# Patient Record
Sex: Female | Born: 1975
Health system: Southern US, Community
[De-identification: ages and names within clinical notes are randomized; demographics above are authoritative.]

## PROBLEM LIST (undated history)

## (undated) DIAGNOSIS — R87629 Unspecified abnormal cytological findings in specimens from vagina: Secondary | ICD-10-CM

## (undated) HISTORY — PX: BREAST ENHANCEMENT SURGERY: SHX7

## (undated) HISTORY — DX: Unspecified abnormal cytological findings in specimens from vagina: R87.629

## (undated) HISTORY — PX: ABLATION: SHX5711

## (undated) HISTORY — PX: COLPOSCOPY: SHX161

---

## 2016-12-21 ENCOUNTER — Emergency Department (INDEPENDENT_AMBULATORY_CARE_PROVIDER_SITE_OTHER)
Admission: EM | Admit: 2016-12-21 | Discharge: 2016-12-21 | Disposition: A | Discharge status: Home / Self Care | Payer: BLUE CROSS/BLUE SHIELD | Source: Home / Self Care | Attending: Family Medicine | Admitting: Family Medicine

## 2016-12-21 ENCOUNTER — Encounter: Payer: Self-pay | Admitting: Emergency Medicine

## 2016-12-21 ENCOUNTER — Emergency Department (INDEPENDENT_AMBULATORY_CARE_PROVIDER_SITE_OTHER): Payer: BLUE CROSS/BLUE SHIELD

## 2016-12-21 DIAGNOSIS — M7741 Metatarsalgia, right foot: Secondary | ICD-10-CM

## 2016-12-21 DIAGNOSIS — M79671 Pain in right foot: Secondary | ICD-10-CM | POA: Diagnosis not present

## 2016-12-21 MED ORDER — MELOXICAM 15 MG PO TABS: 15.0000 mg | ORAL_TABLET | Freq: Every day | ORAL | 0 refills | Status: DC

## 2016-12-21 NOTE — Discharge Instructions (Addendum)
Apply ice pack for 15 to 20 minutes, 2 to 3 times daily  Continue until pain decreases.

## 2016-12-21 NOTE — ED Triage Notes (Signed)
Pt c/o right foot pain on the bottom of her foot only. Started suddenly last night. Denies injury or meds for this.

## 2016-12-21 NOTE — ED Provider Notes (Signed)
[facility]    CSN: [id number] Arrival date & time: 12/21/16  0847     History   Chief Complaint Chief Complaint  Patient presents with  . Foot Pain    HPI Stacey Finley is a 41 y.o. female.   While walking up stairs last night, patient had sudden pain in her right foot over MTP joints that has persisted.  She recalls no injury or recent change in physical activities.  She has pain with weight bearing.  She admits that she exercises regularly, including walking/running on treadmill.   The history is provided by the patient.  Foot Pain  This is a new problem. The current episode started yesterday. The problem occurs constantly. The problem has not changed since onset.The symptoms are aggravated by walking. Nothing relieves the symptoms. She has tried nothing for the symptoms.    History reviewed. No pertinent past medical history.  There are no active problems to display for this patient.   History reviewed. No pertinent surgical history.  OB History    No data available       Home Medications    Prior to Admission medications   Medication Sig Start Date End Date Taking? Authorizing Provider  meloxicam (MOBIC) 15 MG tablet Take 1 tablet (15 mg total) by mouth daily. Take with food each morning 12/21/16   Kandra Nicolas, MD    Family History History reviewed. No pertinent family history.  Social History Social History  Substance Use Topics  . Smoking status: Never Smoker  . Smokeless tobacco: Never Used  . Alcohol use 2.4 oz/week    4 Cans of beer per week     Allergies   Patient has no allergy information on record.   Review of Systems Review of Systems  All other systems reviewed and are negative.    Physical Exam Triage Vital Signs ED Triage Vitals  Enc Vitals Group     BP 12/21/16 0905 125/84     Pulse Rate 12/21/16 0905 68     Resp --      Temp 12/21/16 0905 98.5 F (36.9 C)     Temp Source 12/21/16 0905 Oral     SpO2  12/21/16 0905 98 %     Weight 12/21/16 0906 159 lb (72.1 kg)     Height --      Head Circumference --      Peak Flow --      Pain Score 12/21/16 0906 7     Pain Loc --      Pain Edu? --      Excl. in [place]? --    No data found.   Updated Vital Signs BP 125/84 (BP Location: Right Arm)   Pulse 68   Temp 98.5 F (36.9 C) (Oral)   Wt 159 lb (72.1 kg)   SpO2 98%   Visual Acuity Right Eye Distance:   Left Eye Distance:   Bilateral Distance:    Right Eye Near:   Left Eye Near:    Bilateral Near:     Physical Exam  Constitutional: She appears well-developed and well-nourished. No distress.  HENT:  Head: Normocephalic.  Eyes: Pupils are equal, round, and reactive to light.  Cardiovascular: Normal rate.   Pulmonary/Chest: Effort normal.  Musculoskeletal:       Right foot: There is tenderness and bony tenderness. There is normal range of motion, no swelling, normal capillary refill, no crepitus, no deformity and no laceration.  Feet:  Right foot has distinct tenderness to palpation over the 3rd and 4th MTP joints and metatarsals.  No swelling, erythema, or warmth.  Distal neurovascular function is intact.   Neurological: She is alert.  Skin: Skin is warm and dry.  Nursing note and vitals reviewed.    UC Treatments / Results  Labs (all labs ordered are listed, but only abnormal results are displayed) Labs Reviewed - No data to display  EKG  EKG Interpretation None       Radiology Dg Foot Complete Right  Result Date: 12/21/2016 CLINICAL DATA:  Sudden onset of right foot pain beginning last night. No known injury. EXAM: RIGHT FOOT COMPLETE - 3+ VIEW COMPARISON:  None. FINDINGS: There is no evidence of fracture or dislocation. No erosion or bone lesion. Soft tissues are unremarkable. IMPRESSION: Negative. Electronically Signed   By: Monte Fantasia M.D.   On: 12/21/2016 10:05    Procedures Procedures (including critical care time)  Medications Ordered in  UC Medications - No data to display   Initial Impression / Assessment and Plan / UC Course  I have reviewed the triage vital signs and the nursing notes.  Pertinent labs & imaging results that were available during my care of the patient were reviewed by me and considered in my medical decision making (see chart for details).    Differential includes stress fracture, metatarsalgia, Morton's neuroma, foot sprain.  Sudden onset suggests possibility of stress fracture. Begin Mobic Apply ice pack for 15 to 20 minutes, 2 to 3 times daily  Continue until pain decreases. Followup with Dr. Aundria Mems one week.       Final Clinical Impressions(s) / UC Diagnoses   Final diagnoses:  Metatarsalgia of right foot    New Prescriptions New Prescriptions   MELOXICAM (MOBIC) 15 MG TABLET    Take 1 tablet (15 mg total) by mouth daily. Take with food each morning     Kandra Nicolas, MD 12/21/16 1227

## 2016-12-28 ENCOUNTER — Encounter: Payer: Self-pay | Admitting: Sports Medicine

## 2016-12-28 ENCOUNTER — Ambulatory Visit (INDEPENDENT_AMBULATORY_CARE_PROVIDER_SITE_OTHER): Payer: BLUE CROSS/BLUE SHIELD | Admitting: Sports Medicine

## 2016-12-28 DIAGNOSIS — G5761 Lesion of plantar nerve, right lower limb: Secondary | ICD-10-CM | POA: Insufficient documentation

## 2016-12-28 MED ORDER — MELOXICAM 15 MG PO TABS: ORAL_TABLET | ORAL | 3 refills | Status: DC

## 2016-12-28 NOTE — Assessment & Plan Note (Signed)
Metatarsal pad placed in sandals which resolved her pain.  She will return for custom orthotics with metatarsal pads. Adding meloxicam.

## 2016-12-28 NOTE — Progress Notes (Signed)
   Subjective:    I'm seeing this patient as a consultation for:  Carolee Rota, NP, Dr. Theone Murdoch  CC:  Right foot pain  HPI: This is a pleasant 41 year old female, she has right foot pain, plantar between 3rd and 4th metatarsal heads with numbness and tingling for several weeks.  She was seen in urgent care and referred to me for further evaluation and definitive treatment.  Symptoms are moderate, persistent.  Past medical history, Surgical history, Family history not pertinant except as noted below, Social history, Allergies, and medications have been entered into the medical record, reviewed, and no changes needed.   Review of Systems: No headache, visual changes, nausea, vomiting, diarrhea, constipation, dizziness, abdominal pain, skin rash, fevers, chills, night sweats, weight loss, swollen lymph nodes, body aches, joint swelling, muscle aches, chest pain, shortness of breath, mood changes, visual or auditory hallucinations.   Objective:   General: Well Developed, well nourished, and in no acute distress.  Neuro:  Extra-ocular muscles intact, able to move all 4 extremities, sensation grossly intact.  Deep tendon reflexes tested were normal. Psych: Alert and oriented, mood congruent with affect. ENT:  Ears and nose appear unremarkable.  Hearing grossly normal. Neck: Unremarkable overall appearance, trachea midline.  No visible thyroid enlargement. Eyes: Conjunctivae and lids appear unremarkable.  Pupils equal and round. Skin: Warm and dry, no rashes noted.  Cardiovascular: Pulses palpable, no extremity edema. Right Foot: No visible erythema or swelling. Range of motion is full in all directions. Strength is 5/5 in all directions. No hallux valgus. No pes cavus or pes planus. No abnormal callus noted. No pain over the navicular prominence, or base of fifth metatarsal. No tenderness to palpation of the calcaneal insertion of plantar fascia. No pain at the Achilles  insertion. No pain over the calcaneal bursa. No pain of the retrocalcaneal bursa. Tender to palpation on the plantar third and fourth MTP with reproduction of pain with compression of the third and fourth metatarsal heads. No hallux rigidus or limitus. No tenderness palpation over interphalangeal joints. Neurovascularly intact distally.  Metatarsal pad placed in the standard, this resolved all pain immediately.  Impression and Recommendations:   This case required medical decision making of moderate complexity.  Morton neuroma, right Metatarsal pad placed in sandals which resolved her pain.  She will return for custom orthotics with metatarsal pads. Adding meloxicam.

## 2017-01-01 ENCOUNTER — Ambulatory Visit (INDEPENDENT_AMBULATORY_CARE_PROVIDER_SITE_OTHER): Payer: BLUE CROSS/BLUE SHIELD | Admitting: Sports Medicine

## 2017-01-01 VITALS — BP 138/81 | HR 61 | Wt 158.0 lb

## 2017-01-01 DIAGNOSIS — G5761 Lesion of plantar nerve, right lower limb: Secondary | ICD-10-CM | POA: Diagnosis not present

## 2017-01-01 NOTE — Progress Notes (Signed)
    Patient was fitted for a : standard, cushioned, semi-rigid orthotic. The orthotic was heated and afterward the patient stood on the orthotic blank positioned on the orthotic stand. The patient was positioned in subtalar neutral position and 10 degrees of ankle dorsiflexion in a weight bearing stance. After completion of molding, a stable base was applied to the orthotic blank. The blank was ground to a stable position for weight bearing. Size: 8 Base: [employer] and safety inspector and Padding: Right MT pad. The patient ambulated these, and they were very comfortable.  I spent 40 minutes with this patient, greater than 50% was face-to-face time counseling regarding the below diagnosis.

## 2017-01-01 NOTE — Assessment & Plan Note (Signed)
Administrator as above. Metatarsal pad under the right orthotic

## 2017-01-29 ENCOUNTER — Ambulatory Visit: Payer: BLUE CROSS/BLUE SHIELD | Admitting: Sports Medicine

## 2017-02-01 ENCOUNTER — Ambulatory Visit: Payer: BLUE CROSS/BLUE SHIELD | Admitting: Sports Medicine

## 2017-02-01 DIAGNOSIS — Z0189 Encounter for other specified special examinations: Secondary | ICD-10-CM

## 2017-02-04 ENCOUNTER — Encounter: Payer: Self-pay | Admitting: Sports Medicine

## 2017-02-04 ENCOUNTER — Ambulatory Visit (INDEPENDENT_AMBULATORY_CARE_PROVIDER_SITE_OTHER): Payer: BLUE CROSS/BLUE SHIELD | Admitting: Sports Medicine

## 2017-02-04 DIAGNOSIS — G5761 Lesion of plantar nerve, right lower limb: Secondary | ICD-10-CM | POA: Diagnosis not present

## 2017-02-04 NOTE — Assessment & Plan Note (Addendum)
Pain-free when she wears the custom orthotics with metatarsal pad. Does have some discomfort when walking barefoot understandably. I have recommended that she invest in some good house slippers. Return as needed.

## 2017-02-04 NOTE — Progress Notes (Signed)
  Subjective:    CC: Follow-up  HPI: Morton's neuroma right:  Symptoms completely resolved with custom orthotics and metatarsal pads.  She does have some pain when walking barefoot but understands that I not really have much ability to control this.  Past medical history:  Negative.  See flowsheet/record as well for more information.  Surgical history: Negative.  See flowsheet/record as well for more information.  Family history: Negative.  See flowsheet/record as well for more information.  Social history: Negative.  See flowsheet/record as well for more information.  Allergies, and medications have been entered into the medical record, reviewed, and no changes needed.   Review of Systems: No fevers, chills, night sweats, weight loss, chest pain, or shortness of breath.   Objective:    General: Well Developed, well nourished, and in no acute distress.  Neuro: Alert and oriented x3, extra-ocular muscles intact, sensation grossly intact.  HEENT: Normocephalic, atraumatic, pupils equal round reactive to light, neck supple, no masses, no lymphadenopathy, thyroid nonpalpable.  Skin: Warm and dry, no rashes. Cardiac: Regular rate and rhythm, no murmurs rubs or gallops, no lower extremity edema.  Respiratory: Clear to auscultation bilaterally. Not using accessory muscles, speaking in full sentences.  Impression and Recommendations:    Morton neuroma, right Pain-free when she wears the custom orthotics with metatarsal pad. Does have some discomfort when walking barefoot understandably. I have recommended that she invest in some good house slippers. Return as needed.  ___________________________________________ Gwen Her. Dianah Field, M.D., ABFM., CAQSM. Primary Care and [place] Instructor of [place] of [facility] of Medicine

## 2018-01-07 ENCOUNTER — Other Ambulatory Visit: Payer: Self-pay | Admitting: Sports Medicine

## 2018-01-07 DIAGNOSIS — G5761 Lesion of plantar nerve, right lower limb: Secondary | ICD-10-CM

## 2018-01-10 ENCOUNTER — Other Ambulatory Visit: Payer: Self-pay | Admitting: Sports Medicine

## 2018-01-10 DIAGNOSIS — G5761 Lesion of plantar nerve, right lower limb: Secondary | ICD-10-CM

## 2018-02-14 ENCOUNTER — Encounter: Payer: Self-pay | Admitting: Obstetrics & Gynecology

## 2018-02-14 ENCOUNTER — Ambulatory Visit (INDEPENDENT_AMBULATORY_CARE_PROVIDER_SITE_OTHER): Payer: BLUE CROSS/BLUE SHIELD | Admitting: Obstetrics & Gynecology

## 2018-02-14 VITALS — BP 138/86 | HR 64 | Ht 66.0 in | Wt 154.0 lb

## 2018-02-14 DIAGNOSIS — R5383 Other fatigue: Secondary | ICD-10-CM

## 2018-02-14 DIAGNOSIS — Z01419 Encounter for gynecological examination (general) (routine) without abnormal findings: Secondary | ICD-10-CM

## 2018-02-14 DIAGNOSIS — L853 Xerosis cutis: Secondary | ICD-10-CM

## 2018-02-14 DIAGNOSIS — Z1151 Encounter for screening for human papillomavirus (HPV): Secondary | ICD-10-CM | POA: Diagnosis not present

## 2018-02-14 DIAGNOSIS — Z124 Encounter for screening for malignant neoplasm of cervix: Secondary | ICD-10-CM

## 2018-02-14 NOTE — Progress Notes (Signed)
Last pap smear- over 3 years ago- normal Never had mammogram

## 2018-02-14 NOTE — Progress Notes (Signed)
Subjective:    Stacey Finley is a 42 y.o.married P2 (61 and 85 yo kids) female who presents for an annual exam. The patient has no complaints today. She needs a pap smear. She has low energy and dry skin. The patient is sexually active. GYN screening history: last pap: was normal. The patient wears seatbelts: yes. The patient participates in regular exercise: yes. ( weights) Has the patient ever been transfused or tattooed?: yes. The patient reports that there is not domestic violence in her life.   Menstrual History: OB History    Gravida  3   Para  2   Term  2   Preterm      AB  1   Living  2     SAB  1   TAB      Ectopic      Multiple      Live Births              Menarche age: 27 No LMP recorded. Patient has had an ablation.    The following portions of the patient's history were reviewed and updated as appropriate: allergies, current medications, past family history, past medical history, past social history, past surgical history and problem list.  Review of Systems Pertinent items are noted in HPI.   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), has a major in health and exercise and masters in Manufacturing engineer Married for 8 years, uses a BTL for contraception She had an ablation in 2012, no periods :) Had fat transferred to her breast   Objective:    BP 138/86   Pulse 64   Ht 5' 6"  (1.676 m)   Wt 154 lb (69.9 kg)   BMI 24.86 kg/m   General Appearance:    Alert, cooperative, no distress, appears stated age  Head:    Normocephalic, without obvious abnormality, atraumatic  Eyes:    PERRL, conjunctiva/corneas clear, EOM's intact, fundi    benign, both eyes  Ears:    Normal TM's and external ear canals, both ears  Nose:   Nares normal, septum midline, mucosa normal, no drainage    or sinus tenderness  Throat:   Lips, mucosa, and tongue normal;  teeth and gums normal  Neck:   Supple, symmetrical, trachea midline, no adenopathy;    thyroid:  no enlargement/tenderness/nodules; no carotid   bruit or JVD  Back:     Symmetric, no curvature, ROM normal, no CVA tenderness  Lungs:     Clear to auscultation bilaterally, respirations unlabored  Chest Wall:    No tenderness or deformity   Heart:    Regular rate and rhythm, S1 and S2 normal, no murmur, rub   or gallop  Breast Exam:    No tenderness, masses, or nipple abnormality  Abdomen:     Soft, non-tender, bowel sounds active all four quadrants,    no masses, no organomegaly  Genitalia:    Normal female without lesion, discharge or tenderness, normal size and shape, anteverted, mobile, non-tender, normal adnexal exam      Extremities:   Extremities normal, atraumatic, no cyanosis or edema  Pulses:   2+ and symmetric all extremities  Skin:   Skin color, texture, turgor normal, no rashes or lesions  Lymph nodes:   Cervical, supraclavicular, and axillary nodes normal  Neurologic:   CNII-XII intact, normal strength, sensation and reflexes    throughout  .  Assessment:    Healthy female exam.    Plan:     Mammogram. Thin prep Pap smear. with cotesting Declines flu vaccine

## 2018-02-15 LAB — TSH: TSH: 1.26 m[IU]/L

## 2018-02-16 LAB — CYTOLOGY - PAP
DIAGNOSIS: NEGATIVE
HPV (WINDOPATH): NOT DETECTED

## 2018-03-01 ENCOUNTER — Encounter: Payer: Self-pay | Admitting: Sports Medicine

## 2018-03-01 ENCOUNTER — Ambulatory Visit (INDEPENDENT_AMBULATORY_CARE_PROVIDER_SITE_OTHER): Payer: BLUE CROSS/BLUE SHIELD | Admitting: Sports Medicine

## 2018-03-01 ENCOUNTER — Ambulatory Visit (INDEPENDENT_AMBULATORY_CARE_PROVIDER_SITE_OTHER): Payer: BLUE CROSS/BLUE SHIELD

## 2018-03-01 DIAGNOSIS — M25562 Pain in left knee: Secondary | ICD-10-CM | POA: Insufficient documentation

## 2018-03-01 DIAGNOSIS — M2550 Pain in unspecified joint: Secondary | ICD-10-CM | POA: Insufficient documentation

## 2018-03-01 MED ORDER — MELOXICAM 15 MG PO TABS: ORAL_TABLET | ORAL | 3 refills | Status: AC

## 2018-03-01 NOTE — Assessment & Plan Note (Signed)
Family history of rheumatoid arthritis and gout. She does have her right fourth MCP arthralgias with an episode of swelling. Doing the full rheumatoid testing.

## 2018-03-01 NOTE — Progress Notes (Signed)
Subjective:    I'm seeing this patient as a consultation for: Carolee Rota, NP  CC: Left knee pain  HPI: This is a pleasant 42 year old female, for the past 3 days she is had pain in the medial aspect of her left knee, this occurred after working out in the gym, it was upper body day, she did not do much cardio, no deep squatting, no twisting.  She had moderate swelling in the knee that occurred over a day or 2, as well as in her right fourth MCP.  This then resolved, she has residual pain at the medial joint line of the knee without mechanical symptoms.  I reviewed the past medical history, family history, social history, surgical history, and allergies today and no changes were needed.  Please see the problem list section below in epic for further details.  Past Medical History: Past Medical History:  Diagnosis Date  . Vaginal Pap smear, abnormal    Past Surgical History: Past Surgical History:  Procedure Laterality Date  . ABLATION    . COLPOSCOPY     Social History: Social History   Socioeconomic History  . Marital status: Married    Spouse name: Not on file  . Number of children: Not on file  . Years of education: Not on file  . Highest education level: Not on file  Occupational History  . Not on file  Social Needs  . Financial resource strain: Not on file  . Food insecurity:    Worry: Not on file    Inability: Not on file  . Transportation needs:    Medical: Not on file    Non-medical: Not on file  Tobacco Use  . Smoking status: Never Smoker  . Smokeless tobacco: Never Used  Substance and Sexual Activity  . Alcohol use: Yes    Alcohol/week: 4.0 standard drinks    Types: 4 Cans of beer per week    Comment: socially  . Drug use: Never  . Sexual activity: Yes    Birth control/protection: Surgical  Lifestyle  . Physical activity:    Days per week: Not on file    Minutes per session: Not on file  . Stress: Not on file  Relationships  . Social  connections:    Talks on phone: Not on file    Gets together: Not on file    Attends religious service: Not on file    Active member of club or organization: Not on file    Attends meetings of clubs or organizations: Not on file    Relationship status: Not on file  Other Topics Concern  . Not on file  Social History Narrative  . Not on file   Family History: Family History  Problem Relation Age of Onset  . Colon cancer Paternal Grandfather   . Colon cancer Paternal Grandmother   . Colon cancer Father   . Colon cancer Brother    Allergies: No Known Allergies Medications: See med rec.  Review of Systems: No headache, visual changes, nausea, vomiting, diarrhea, constipation, dizziness, abdominal pain, skin rash, fevers, chills, night sweats, weight loss, swollen lymph nodes, body aches, joint swelling, muscle aches, chest pain, shortness of breath, mood changes, visual or auditory hallucinations.   Objective:   General: Well Developed, well nourished, and in no acute distress.  Neuro:  Extra-ocular muscles intact, able to move all 4 extremities, sensation grossly intact.  Deep tendon reflexes tested were normal. Psych: Alert and oriented, mood congruent with affect. ENT:  Ears and nose appear unremarkable.  Hearing grossly normal. Neck: Unremarkable overall appearance, trachea midline.  No visible thyroid enlargement. Eyes: Conjunctivae and lids appear unremarkable.  Pupils equal and round. Skin: Warm and dry, no rashes noted.  Cardiovascular: Pulses palpable, no extremity edema. Left knee: Normal to inspection with no erythema or effusion or obvious bony abnormalities. Tender to palpation at the medial joint line Pain with terminal flexion past 90 degrees. Ligaments with solid consistent endpoints including ACL, PCL, LCL, MCL. Negative Mcmurray's and provocative meniscal tests. Non painful patellar compression. Patellar and quadriceps tendons unremarkable. Hamstring and  quadriceps strength is normal.  Impression and Recommendations:   This case required medical decision making of moderate complexity.  Left knee pain Suspect meniscal injury, no gross mechanical symptoms. Physical therapy, x-rays, meloxicam.   Polyarthralgia Family history of rheumatoid arthritis and gout. She does have her right fourth MCP arthralgias with an episode of swelling. Doing the full rheumatoid testing. ___________________________________________ Gwen Her. Dianah Field, M.D., ABFM., CAQSM. Primary Care and [place] Instructor of [place] of [facility] of Medicine

## 2018-03-01 NOTE — Assessment & Plan Note (Signed)
Suspect meniscal injury, no gross mechanical symptoms. Physical therapy, x-rays, meloxicam.

## 2018-03-04 ENCOUNTER — Ambulatory Visit (INDEPENDENT_AMBULATORY_CARE_PROVIDER_SITE_OTHER): Payer: BLUE CROSS/BLUE SHIELD

## 2018-03-04 DIAGNOSIS — Z01419 Encounter for gynecological examination (general) (routine) without abnormal findings: Secondary | ICD-10-CM

## 2018-03-04 DIAGNOSIS — Z1231 Encounter for screening mammogram for malignant neoplasm of breast: Secondary | ICD-10-CM | POA: Diagnosis not present

## 2018-03-08 LAB — CBC WITH DIFFERENTIAL/PLATELET
Basophils Absolute: 50 {cells}/uL (ref 0–200)
Basophils Relative: 1.2 %
Eosinophils Absolute: 80 {cells}/uL (ref 15–500)
Eosinophils Relative: 1.9 %
HCT: 39.3 % (ref 35.0–45.0)
Hemoglobin: 13.1 g/dL (ref 11.7–15.5)
Lymphs Abs: 2360 {cells}/uL (ref 850–3900)
MCH: 30 pg (ref 27.0–33.0)
MCHC: 33.3 g/dL (ref 32.0–36.0)
MCV: 89.9 fL (ref 80.0–100.0)
MPV: 10 fL (ref 7.5–12.5)
Monocytes Relative: 7.1 %
Neutro Abs: 1411 {cells}/uL — ABNORMAL LOW (ref 1500–7800)
Neutrophils Relative %: 33.6 %
Platelets: 346 10*3/uL (ref 140–400)
RBC: 4.37 10*6/uL (ref 3.80–5.10)
RDW: 11.8 % (ref 11.0–15.0)
Total Lymphocyte: 56.2 %
WBC mixed population: 298 cells/uL (ref 200–950)
WBC: 4.2 10*3/uL (ref 3.8–10.8)

## 2018-03-08 LAB — COMPREHENSIVE METABOLIC PANEL WITH GFR
AG Ratio: 1.3 (calc) (ref 1.0–2.5)
ALT: 11 U/L (ref 6–29)
AST: 18 U/L (ref 10–30)
Alkaline phosphatase (APISO): 75 U/L (ref 33–115)
CO2: 28 mmol/L (ref 20–32)
Calcium: 9.6 mg/dL (ref 8.6–10.2)
Sodium: 137 mmol/L (ref 135–146)

## 2018-03-08 LAB — RHEUMATOID ARTHRITIS DIAGNOSTIC PANEL, COMPREHENSIVE
Cyclic Citrullin Peptide Ab: <16 U (ref <20)
Rheumatoid Factor (IgA): <5 U (ref <=6)
Rheumatoid Factor (IgG): <5 U (ref <=6)
Rheumatoid Factor (IgM): 8 U — ABNORMAL HIGH (ref <=6)
SSA (Ro) (ENA) Antibody, IgG: <1 AI
SSB (La) (ENA) Antibody, IgG: <1 AI

## 2018-03-08 LAB — ANA, IFA COMPREHENSIVE PANEL
Anti Nuclear Antibody(ANA): NEGATIVE
ENA SM Ab Ser-aCnc: <1 AI
SM/RNP: <1 AI
SSA (Ro) (ENA) Antibody, IgG: <1 AI
SSB (La) (ENA) Antibody, IgG: <1 AI
Scleroderma (Scl-70) (ENA) Antibody, IgG: <1 AI
ds DNA Ab: <1 [IU]/mL

## 2018-03-08 LAB — SEDIMENTATION RATE: Sed Rate: 22 mm/h — ABNORMAL HIGH (ref 0–20)

## 2018-03-08 LAB — COMPREHENSIVE METABOLIC PANEL
Albumin: 4 g/dL (ref 3.6–5.1)
BUN: 17 mg/dL (ref 7–25)
Chloride: 103 mmol/L (ref 98–110)
Creat: 0.96 mg/dL (ref 0.50–1.10)
Globulin: 3.1 g/dL (calc) (ref 1.9–3.7)
Glucose, Bld: 79 mg/dL (ref 65–139)
Potassium: 4.4 mmol/L (ref 3.5–5.3)
Total Bilirubin: 0.5 mg/dL (ref 0.2–1.2)
Total Protein: 7.1 g/dL (ref 6.1–8.1)

## 2018-03-08 LAB — URIC ACID: Uric Acid, Serum: 3.6 mg/dL (ref 2.5–7.0)

## 2018-03-09 ENCOUNTER — Ambulatory Visit: Payer: BLUE CROSS/BLUE SHIELD | Admitting: Rehabilitative and Restorative Service Providers"

## 2018-03-29 ENCOUNTER — Ambulatory Visit (INDEPENDENT_AMBULATORY_CARE_PROVIDER_SITE_OTHER): Payer: BLUE CROSS/BLUE SHIELD | Admitting: Sports Medicine

## 2018-03-29 ENCOUNTER — Encounter: Payer: Self-pay | Admitting: Sports Medicine

## 2018-03-29 DIAGNOSIS — M2550 Pain in unspecified joint: Secondary | ICD-10-CM | POA: Diagnosis not present

## 2018-03-29 DIAGNOSIS — M25562 Pain in left knee: Secondary | ICD-10-CM

## 2018-03-29 NOTE — Assessment & Plan Note (Signed)
Resolved with meloxicam. She does have a family history of rheumatoid arthritis and gout and notices that her right fourth MCP tends to swell with eating pork. Meloxicam has however for the most part resolved her symptoms, uric acid was normal, she did have a minimally positive rheumatoid factor but not enough to consider this rheumatoid arthritis. If she has another flare in symptoms we will probably test her again.

## 2018-03-29 NOTE — Progress Notes (Signed)
Subjective:    CC: Follow-up  HPI: Knee pain: Resolved with meloxicam.  I reviewed the past medical history, family history, social history, surgical history, and allergies today and no changes were needed.  Please see the problem list section below in epic for further details.  Past Medical History: Past Medical History:  Diagnosis Date  . Vaginal Pap smear, abnormal    Past Surgical History: Past Surgical History:  Procedure Laterality Date  . ABLATION    . COLPOSCOPY     Social History: Social History   Socioeconomic History  . Marital status: Married    Spouse name: Not on file  . Number of children: Not on file  . Years of education: Not on file  . Highest education level: Not on file  Occupational History  . Not on file  Social Needs  . Financial resource strain: Not on file  . Food insecurity:    Worry: Not on file    Inability: Not on file  . Transportation needs:    Medical: Not on file    Non-medical: Not on file  Tobacco Use  . Smoking status: Never Smoker  . Smokeless tobacco: Never Used  Substance and Sexual Activity  . Alcohol use: Yes    Alcohol/week: 4.0 standard drinks    Types: 4 Cans of beer per week    Comment: socially  . Drug use: Never  . Sexual activity: Yes    Birth control/protection: Surgical  Lifestyle  . Physical activity:    Days per week: Not on file    Minutes per session: Not on file  . Stress: Not on file  Relationships  . Social connections:    Talks on phone: Not on file    Gets together: Not on file    Attends religious service: Not on file    Active member of club or organization: Not on file    Attends meetings of clubs or organizations: Not on file    Relationship status: Not on file  Other Topics Concern  . Not on file  Social History Narrative  . Not on file   Family History: Family History  Problem Relation Age of Onset  . Colon cancer Paternal Grandfather   . Colon cancer Paternal Grandmother   .  Colon cancer Father   . Colon cancer Brother    Allergies: No Known Allergies Medications: See med rec.  Review of Systems: No fevers, chills, night sweats, weight loss, chest pain, or shortness of breath.   Objective:    General: Well Developed, well nourished, and in no acute distress.  Neuro: Alert and oriented x3, extra-ocular muscles intact, sensation grossly intact.  HEENT: Normocephalic, atraumatic, pupils equal round reactive to light, neck supple, no masses, no lymphadenopathy, thyroid nonpalpable.  Skin: Warm and dry, no rashes. Cardiac: Regular rate and rhythm, no murmurs rubs or gallops, no lower extremity edema.  Respiratory: Clear to auscultation bilaterally. Not using accessory muscles, speaking in full sentences.  Impression and Recommendations:    Left knee pain Resolved with meloxicam.  Polyarthralgia Resolved with meloxicam. She does have a family history of rheumatoid arthritis and gout and notices that her right fourth MCP tends to swell with eating pork. Meloxicam has however for the most part resolved her symptoms, uric acid was normal, she did have a minimally positive rheumatoid factor but not enough to consider this rheumatoid arthritis. If she has another flare in symptoms we will probably test her again. ___________________________________________ Gwen Her. Dianah Field, M.D.,  ABFM., CAQSM. Primary Care and Sports Medicine [facility]  Adjunct Professor of [place] of [facility] of Medicine

## 2018-03-29 NOTE — Assessment & Plan Note (Signed)
Resolved with meloxicam.

## 2019-10-02 DIAGNOSIS — S01511A Laceration without foreign body of lip, initial encounter: Secondary | ICD-10-CM | POA: Diagnosis not present

## 2020-02-10 IMAGING — MG DIGITAL SCREENING BILATERAL MAMMOGRAM WITH TOMO AND CAD
8 series · 9 of 24 positions shown · non-contrast
Comparison: None.

CLINICAL DATA: Screening.

EXAM:
DIGITAL SCREENING BILATERAL MAMMOGRAM WITH TOMO AND CAD

[L MLO synth-2D]
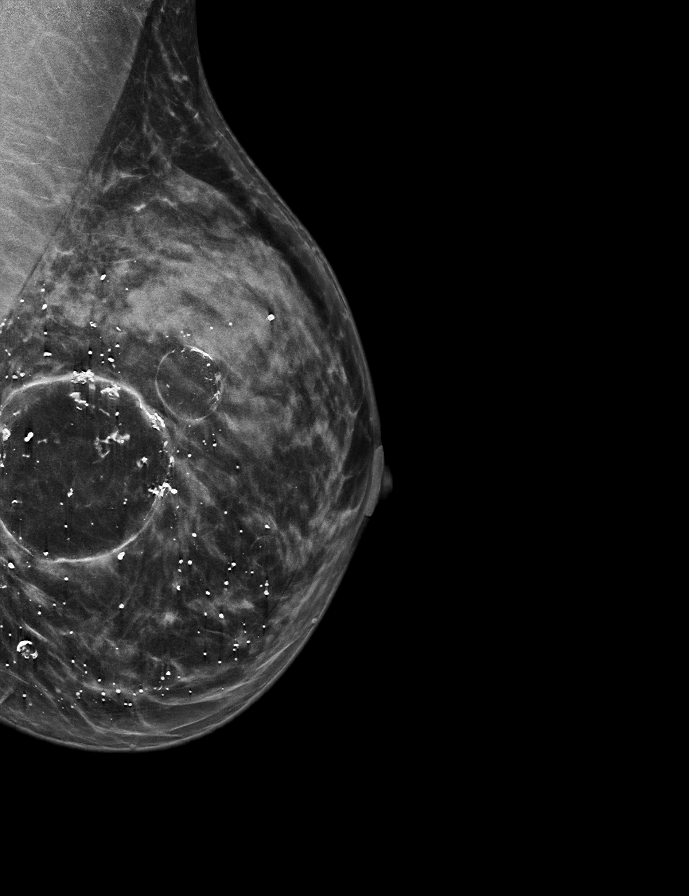

[R CC synth-2D]
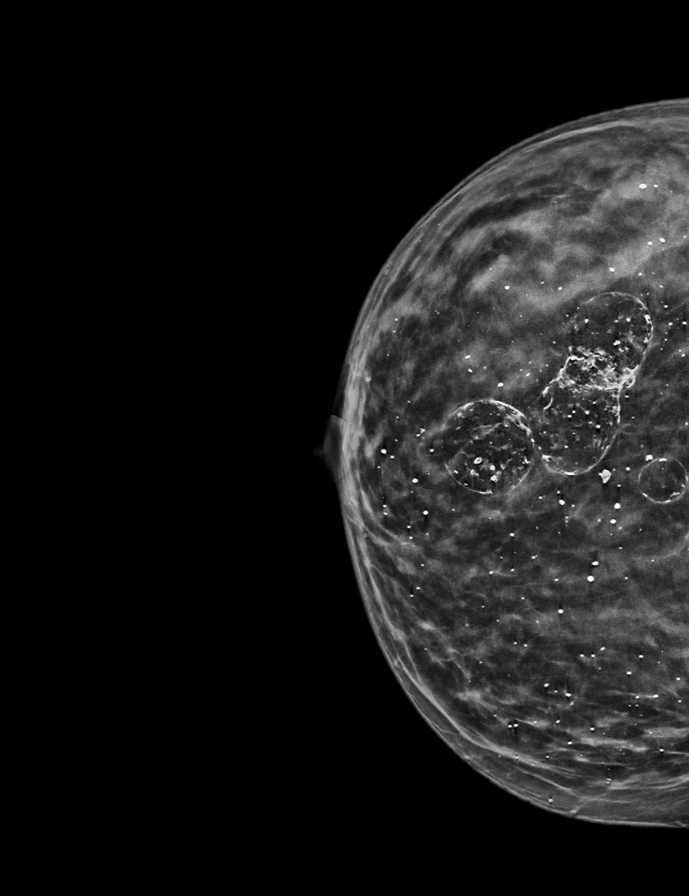

[R MLO synth-2D]
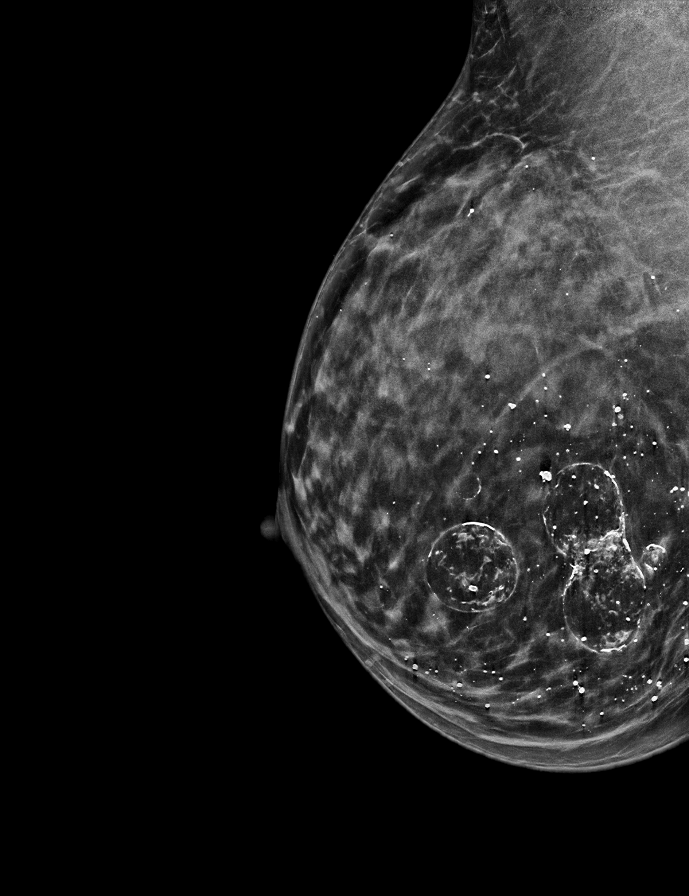

[L CC synth-2D]
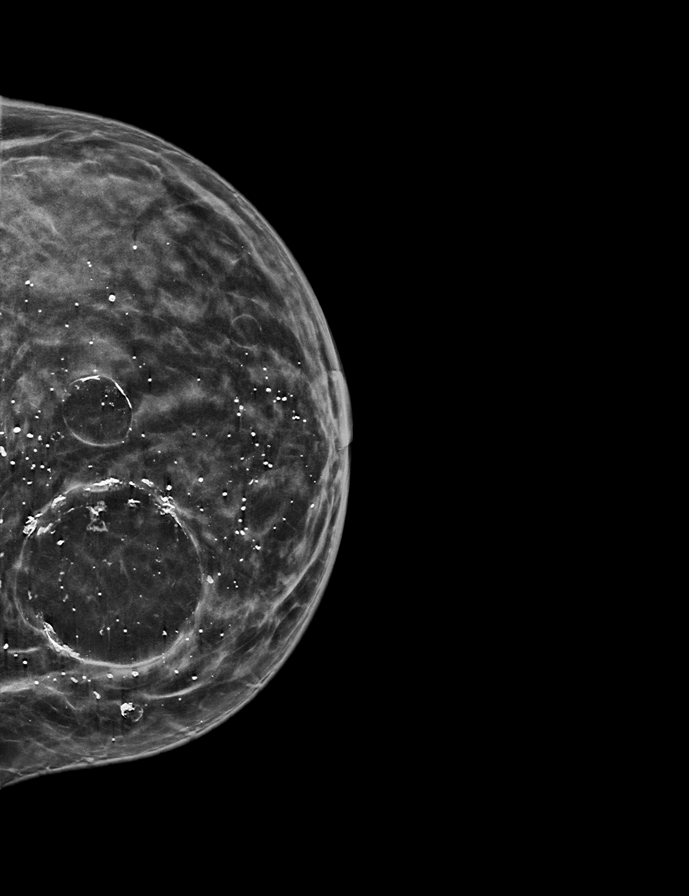

[R MLO tomo · 2 of 61 frames shown]
[frame 20/61]
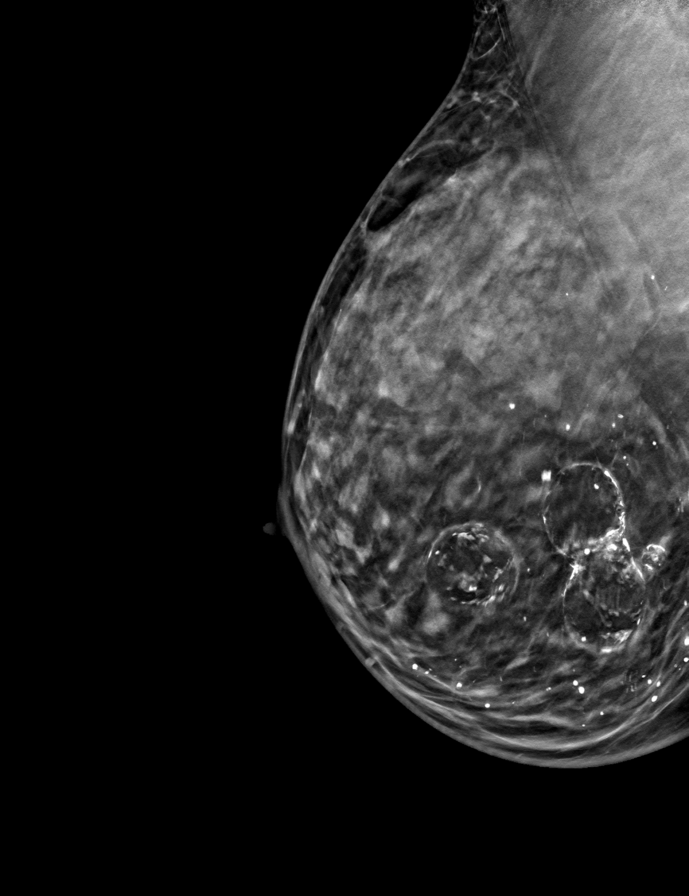
[frame 31/61]
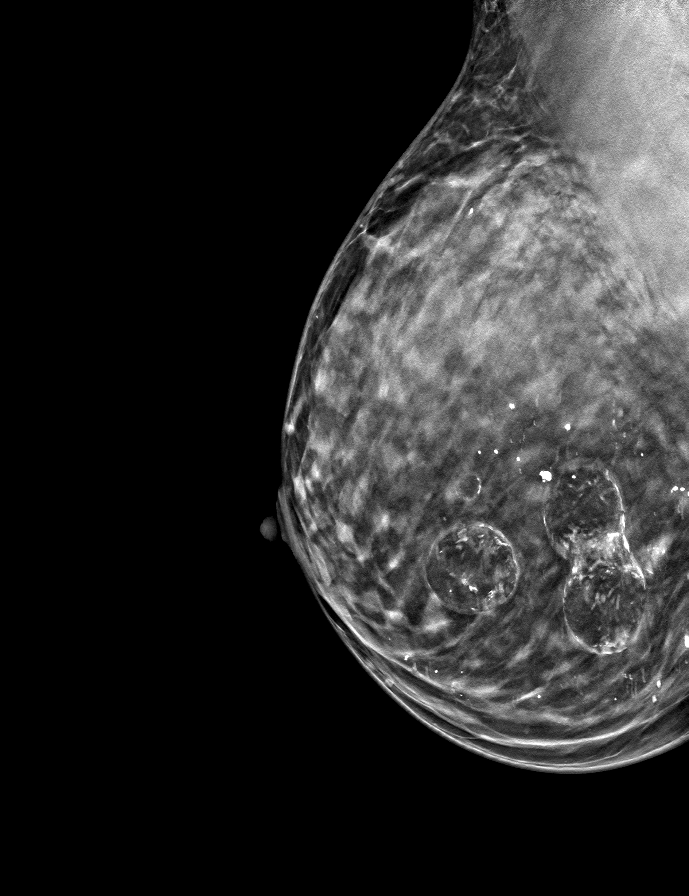

[L MLO tomo · tomo slice 33/64.0]
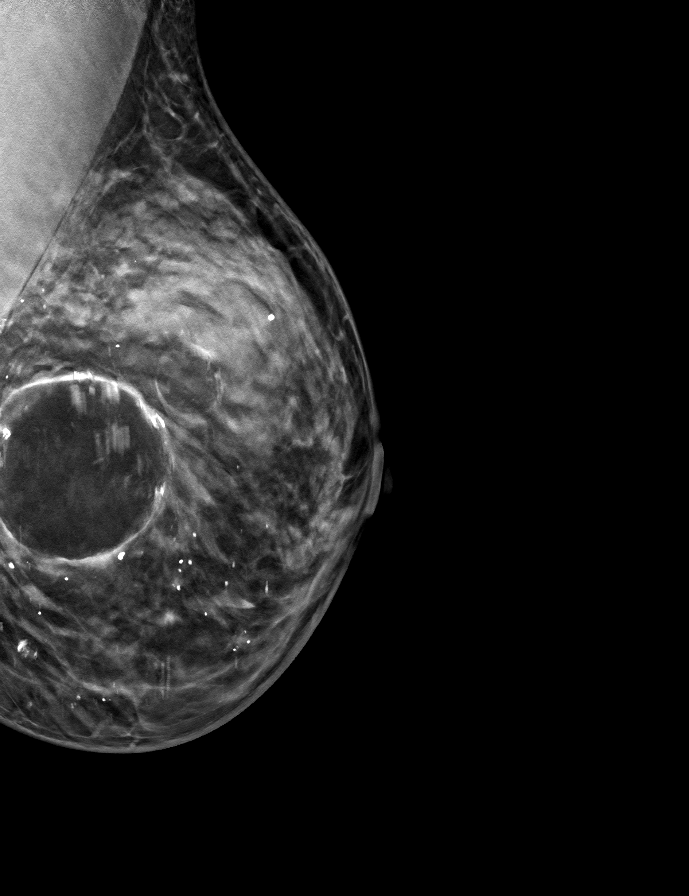

[R CC tomo · tomo slice 27/54.0]
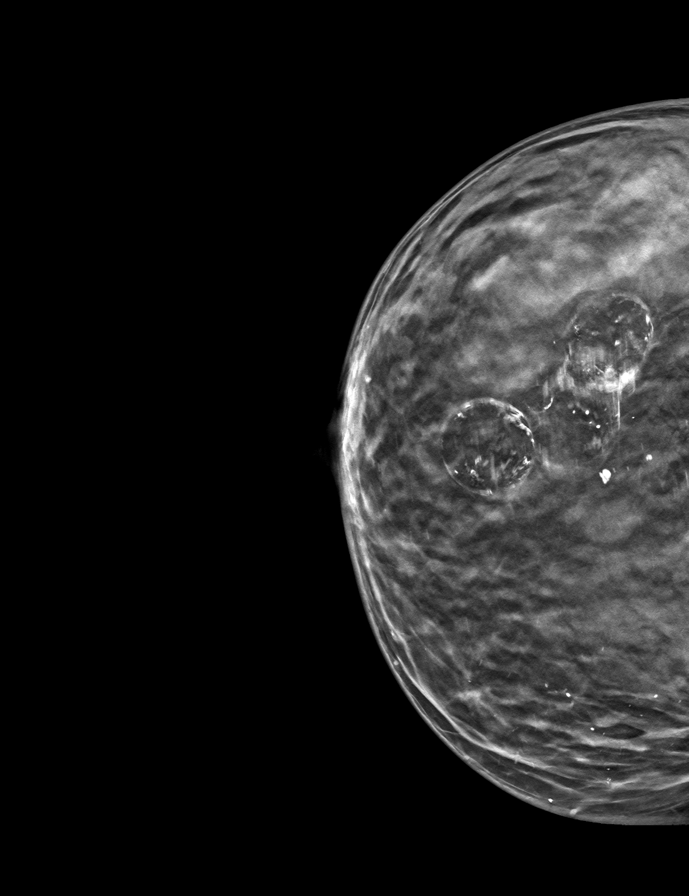

[L CC tomo · tomo slice 28/55.0]
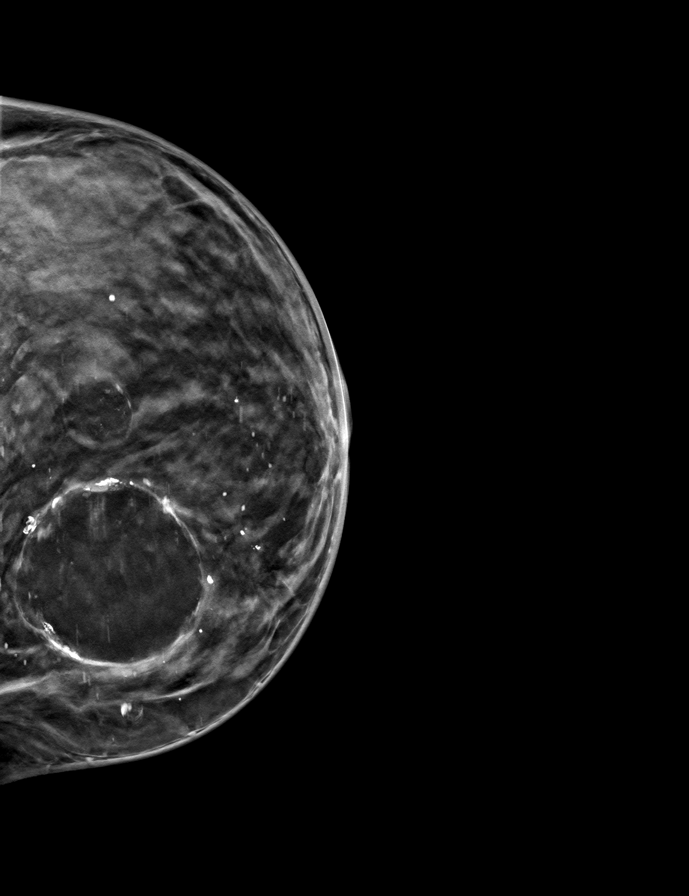

[9 of 24 positions shown; findings below may reference images not displayed]

ACR Breast Density Category c: The breast tissue is heterogeneously
dense, which may obscure small masses
FINDINGS: Benign fat necrosis as well as round and punctate calcifications are
scattered throughout the bilateral breasts. There are no findings
suspicious for malignancy. Images were processed with CAD.
IMPRESSION: No mammographic evidence of malignancy. A result letter of this
screening mammogram will be mailed directly to the patient.

RECOMMENDATION:
Screening mammogram in one year. (Code:FT-8-RSP)

BI-RADS CATEGORY  2: Benign.

## 2021-03-20 ENCOUNTER — Encounter: Payer: Self-pay | Admitting: Obstetrics & Gynecology

## 2021-03-20 ENCOUNTER — Ambulatory Visit (INDEPENDENT_AMBULATORY_CARE_PROVIDER_SITE_OTHER): Payer: BC Managed Care – PPO | Admitting: Obstetrics & Gynecology

## 2021-03-20 ENCOUNTER — Other Ambulatory Visit (HOSPITAL_COMMUNITY)
Admission: RE | Admit: 2021-03-20 | Discharge: 2021-03-20 | Disposition: A | Discharge status: Home / Self Care | Payer: BC Managed Care – PPO | Source: Ambulatory Visit | Attending: Obstetrics & Gynecology | Admitting: Obstetrics & Gynecology

## 2021-03-20 ENCOUNTER — Other Ambulatory Visit: Payer: Self-pay

## 2021-03-20 VITALS — BP 122/74 | HR 62 | Ht 66.0 in | Wt 163.0 lb

## 2021-03-20 DIAGNOSIS — Z01419 Encounter for gynecological examination (general) (routine) without abnormal findings: Secondary | ICD-10-CM | POA: Diagnosis not present

## 2021-03-20 DIAGNOSIS — Z113 Encounter for screening for infections with a predominantly sexual mode of transmission: Secondary | ICD-10-CM

## 2021-03-20 DIAGNOSIS — Z1211 Encounter for screening for malignant neoplasm of colon: Secondary | ICD-10-CM | POA: Diagnosis not present

## 2021-03-20 DIAGNOSIS — Z1231 Encounter for screening mammogram for malignant neoplasm of breast: Secondary | ICD-10-CM

## 2021-03-20 NOTE — Patient Instructions (Signed)
Preventive Care 40-45 Years Old, Female Preventive care refers to lifestyle choices and visits with your health care provider that can promote health and wellness. This includes: A yearly physical exam. This is also called an annual wellness visit. Regular dental and eye exams. Immunizations. Screening for certain conditions. Healthy lifestyle choices, such as: Eating a healthy diet. Getting regular exercise. Not using drugs or products that contain nicotine and tobacco. Limiting alcohol use. What can I expect for my preventive care visit? Physical exam Your health care provider will check your: Height and weight. These may be used to calculate your BMI (body mass index). BMI is a measurement that tells if you are at a healthy weight. Heart rate and blood pressure. Body temperature. Skin for abnormal spots. Counseling Your health care provider may ask you questions about your: Past medical problems. Family's medical history. Alcohol, tobacco, and drug use. Emotional well-being. Home life and relationship well-being. Sexual activity. Diet, exercise, and sleep habits. Work and work environment. Access to firearms. Method of birth control. Menstrual cycle. Pregnancy history. What immunizations do I need? Vaccines are usually given at various ages, according to a schedule. Your health care provider will recommend vaccines for you based on your age, medical history, and lifestyle or other factors, such as travel or where you work. What tests do I need? Blood tests Lipid and cholesterol levels. These may be checked every 5 years, or more often if you are over 50 years old. Hepatitis C test. Hepatitis B test. Screening Lung cancer screening. You may have this screening every year starting at age 55 if you have a 30-pack-year history of smoking and currently smoke or have quit within the past 15 years. Colorectal cancer screening. All adults should have this screening starting at  age 50 and continuing until age 75. Your health care provider may recommend screening at age 45 if you are at increased risk. You will have tests every 1-10 years, depending on your results and the type of screening test. Diabetes screening. This is done by checking your blood sugar (glucose) after you have not eaten for a while (fasting). You may have this done every 1-3 years. Mammogram. This may be done every 1-2 years. Talk with your health care provider about when you should start having regular mammograms. This may depend on whether you have a family history of breast cancer. BRCA-related cancer screening. This may be done if you have a family history of breast, ovarian, tubal, or peritoneal cancers. Pelvic exam and Pap test. This may be done every 3 years starting at age 21. Starting at age 30, this may be done every 5 years if you have a Pap test in combination with an HPV test. Other tests STD (sexually transmitted disease) testing, if you are at risk. Bone density scan. This is done to screen for osteoporosis. You may have this scan if you are at high risk for osteoporosis. Talk with your health care provider about your test results, treatment options, and if necessary, the need for more tests. Follow these instructions at home: Eating and drinking  Eat a diet that includes fresh fruits and vegetables, whole grains, lean protein, and low-fat dairy products. Take vitamin and mineral supplements as recommended by your health care provider. Do not drink alcohol if: Your health care provider tells you not to drink. You are pregnant, may be pregnant, or are planning to become pregnant. If you drink alcohol: Limit how much you have to 0-1 drink a day. Be   aware of how much alcohol is in your drink. In the [place] one drink equals one 12 oz bottle of beer (355 mL), one 5 oz glass of wine (148 mL), or one 1 oz glass of hard liquor (44 mL). Lifestyle Take daily care of your teeth and  gums. Brush your teeth every morning and night with fluoride toothpaste. Floss one time each day. Stay active. Exercise for at least 30 minutes 5 or more days each week. Do not use any products that contain nicotine or tobacco, such as cigarettes, e-cigarettes, and chewing tobacco. If you need help quitting, ask your health care provider. Do not use drugs. If you are sexually active, practice safe sex. Use a condom or other form of protection to prevent STIs (sexually transmitted infections). If you do not wish to become pregnant, use a form of birth control. If you plan to become pregnant, see your health care provider for a prepregnancy visit. If told by your health care provider, take low-dose aspirin daily starting at age 63. Find healthy ways to cope with stress, such as: Meditation, yoga, or listening to music. Journaling. Talking to a trusted person. Spending time with friends and family. Safety Always wear your seat belt while driving or riding in a vehicle. Do not drive: If you have been drinking alcohol. Do not ride with someone who has been drinking. When you are tired or distracted. While texting. Wear a helmet and other protective equipment during sports activities. If you have firearms in your house, make sure you follow all gun safety procedures. What's next? Visit your health care provider once a year for an annual wellness visit. Ask your health care provider how often you should have your eyes and teeth checked. Stay up to date on all vaccines. This information is not intended to replace advice given to you by your health care provider. Make sure you discuss any questions you have with your health care provider. Document Revised: 07/19/2020 Document Reviewed: 01/20/2018 Elsevier Patient Education  2022 [employer].

## 2021-03-20 NOTE — Progress Notes (Signed)
GYNECOLOGY ANNUAL PREVENTATIVE CARE ENCOUNTER NOTE  History:     Stacey Finley is a 45 y.o. G89P2012 female here for a routine annual gynecologic exam.  Current complaints: none.   Denies abnormal vaginal bleeding, discharge, pelvic pain, problems with intercourse or other gynecologic concerns.    Gynecologic History No LMP recorded. Patient has had an ablation. Contraception: none Last Pap: 02/14/2018. Result was normal with negative HPV Last Mammogram: 03/04/2018.  Result was normal  Obstetric History OB History  Gravida Para Term Preterm AB Living  3 2 2   1 2   SAB IAB Ectopic Multiple Live Births  1            # Outcome Date GA Lbr Len/2nd Weight Sex Delivery Anes PTL Lv  3 SAB           2 Term           1 Term             Past Medical History:  Diagnosis Date   Vaginal Pap smear, abnormal     Past Surgical History:  Procedure Laterality Date   ABLATION     BREAST ENHANCEMENT SURGERY     Fat deposits from flank   COLPOSCOPY      Current Outpatient Medications on File Prior to Visit  Medication Sig Dispense Refill   meloxicam (MOBIC) 15 MG tablet One tab PO qAM with breakfast for 2 weeks, then daily prn pain. (Patient not taking: Reported on 03/20/2021) 30 tablet 3   Multiple Vitamin (MULTIVITAMIN) tablet Take 1 tablet by mouth daily. (Patient not taking: Reported on 03/20/2021)     No current facility-administered medications on file prior to visit.    No Known Allergies  Social History:  reports that she has never smoked. She has never used smokeless tobacco. She reports current alcohol use of about 4.0 standard drinks per week. She reports that she does not use drugs.  Family History  Problem Relation Age of Onset   Colon cancer Paternal Grandfather    Colon cancer Paternal Grandmother    Colon cancer Father    Colon cancer Brother     The following portions of the patient's history were reviewed and updated as appropriate: allergies, current  medications, past family history, past medical history, past social history, past surgical history and problem list.  Review of Systems Pertinent items noted in HPI and remainder of comprehensive ROS otherwise negative.  Physical Exam:  BP 122/74   Pulse 62   Ht 5' 6"  (1.676 m)   Wt 163 lb (73.9 kg)   BMI 26.31 kg/m  CONSTITUTIONAL: Well-developed, well-nourished female in no acute distress.  HENT:  Normocephalic, atraumatic, External right and left ear normal.  EYES: Conjunctivae and EOM are normal. Pupils are equal, round, and reactive to light. No scleral icterus.  NECK: Normal range of motion, supple, no masses.  Normal thyroid.  SKIN: Skin is warm and dry. No rash noted. Not diaphoretic. No erythema. No pallor. MUSCULOSKELETAL: Normal range of motion. No tenderness.  No cyanosis, clubbing, or edema. NEUROLOGIC: Alert and oriented to person, place, and time. Normal reflexes, muscle tone coordination.  PSYCHIATRIC: Normal mood and affect. Normal behavior. Normal judgment and thought content. CARDIOVASCULAR: Normal heart rate noted, regular rhythm RESPIRATORY: Clear to auscultation bilaterally. Effort and breath sounds normal, no problems with respiration noted. BREASTS: Symmetric in size. Large hard, round, masses palpated bilaterally consistent with fat deposits (history of fat transfer breast augmentation years ago).  No tenderness, skin changes, nipple drainage, or lymphadenopathy bilaterally. Performed in the presence of a chaperone. ABDOMEN: Soft, no distention noted.  No tenderness, rebound or guarding.  PELVIC: Normal appearing external genitalia and urethral meatus; normal appearing vaginal mucosa and cervix.  No abnormal vaginal discharge noted.  Pap smear obtained.  Normal uterine size, no other palpable masses, no uterine or adnexal tenderness.  Performed in the presence of a chaperone.   Assessment and Plan:    1. Breast cancer screening by mammogram Mammogram scheduled -  MM 3D SCREEN BREAST BILATERAL; Future  2. Colon cancer screening - Cologuard ordered after discussion with patient  3. Routine screening for STI (sexually transmitted infection) Ancillary testing done from pap, declined serum testing today. - Cytology - PAP  4. Well woman exam with routine gynecological exam - Cytology - PAP Will follow up results of pap smear and manage accordingly. Routine preventative health maintenance measures emphasized. Please refer to After Visit Summary for other counseling recommendations.      Verita Schneiders, MD, [facility] [employer] [facility], [place]

## 2021-03-20 NOTE — Addendum Note (Signed)
Addended by: Verita Schneiders A on: 03/20/2021 08:39 AM   Modules accepted: Level of Service

## 2021-03-25 LAB — CYTOLOGY - PAP
Chlamydia: NEGATIVE
Comment: NEGATIVE
Comment: NEGATIVE
Comment: NEGATIVE
Comment: NORMAL
Diagnosis: NEGATIVE
High risk HPV: NEGATIVE
Neisseria Gonorrhea: NEGATIVE
Trichomonas: NEGATIVE

## 2021-04-08 LAB — COLOGUARD: COLOGUARD: NEGATIVE
